# Patient Record
Sex: Male | Born: 1995 | Race: White | Hispanic: No | Marital: Single | Smoking: Never smoker
Health system: Southern US, Community
[De-identification: ages and names within clinical notes are randomized; demographics above are authoritative.]

---
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
## 2017-09-22 IMAGING — CT CT ABD-PELV W/ CM
2 of 4 series · 16 of 46 positions shown, 18 images · IV contrast (iopamidol)
Comparison: None.

CLINICAL DATA: Right lower quadrant abdominal pain beginning
earlier today. Nausea.

EXAM:
CT ABDOMEN AND PELVIS WITH CONTRAST
TECHNIQUE: Multidetector CT imaging of the abdomen and pelvis was performed
using the standard protocol following bolus administration of
intravenous contrast.
CONTRAST:  100mL [id number] IOPAMIDOL ([id number]) INJECTION 61%

[Series 2: axial st · axial · 0.68mm/px · z∈[-594,-204]mm · 13 of 86 slices shown, 15 images]
[im 4/86  soft-tissue]
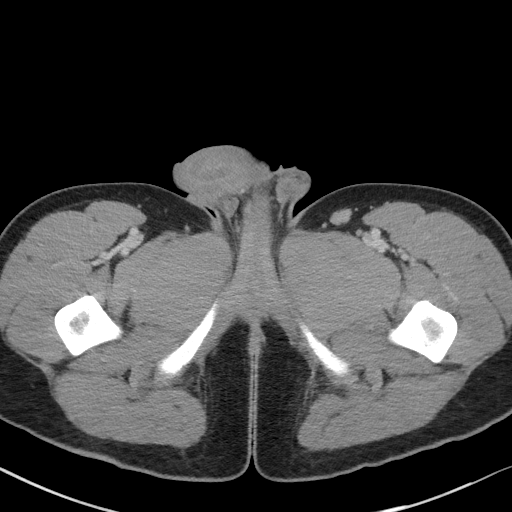
[im 4/86  bone]
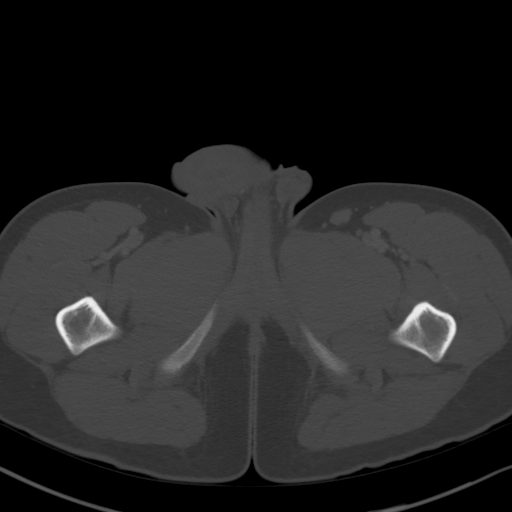
[im 11/86  soft-tissue]
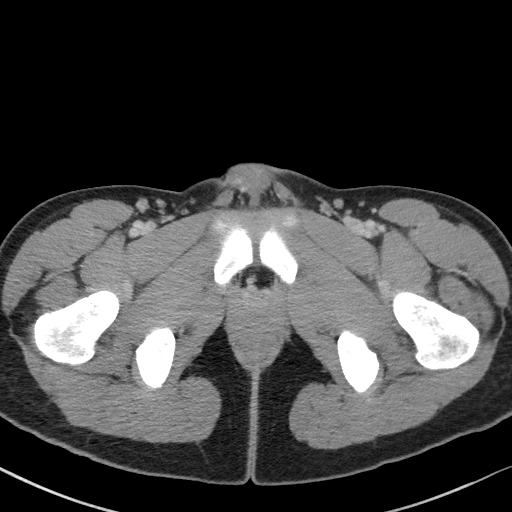
[im 18/86  soft-tissue]
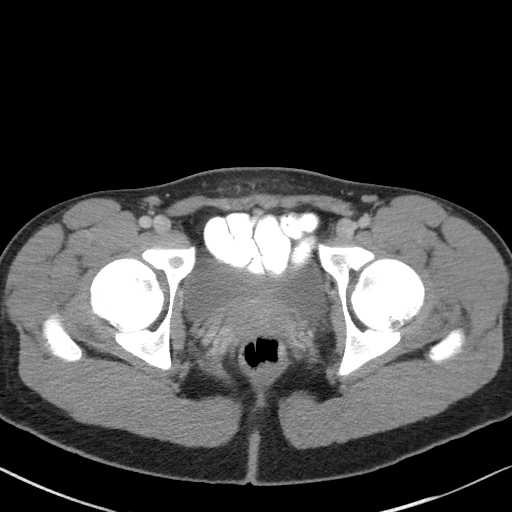
[im 25/86  soft-tissue]
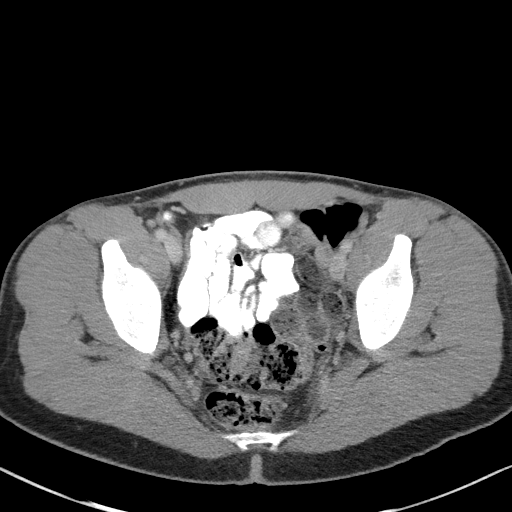
[im 29/86  soft-tissue]
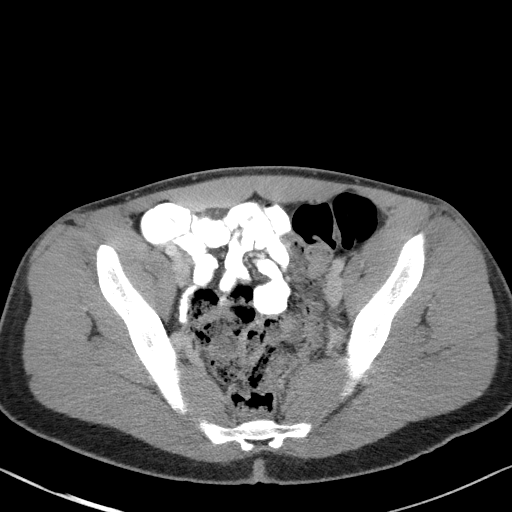
[im 36/86  soft-tissue]
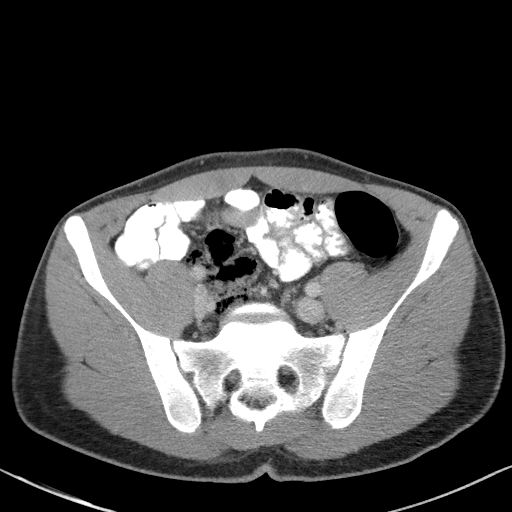
[im 43/86  soft-tissue]
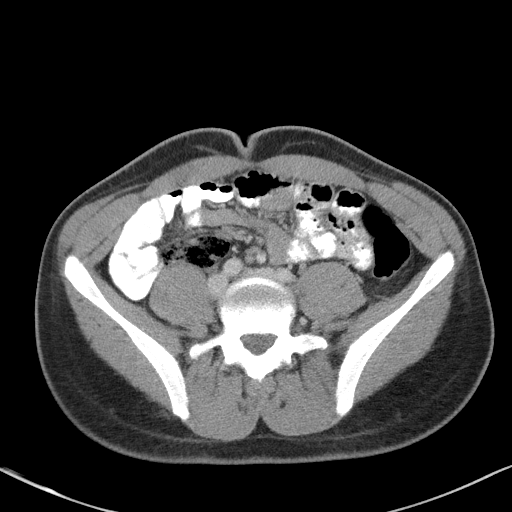
[im 50/86  soft-tissue]
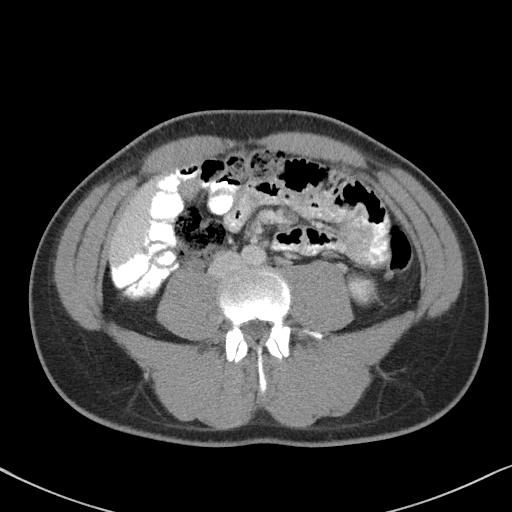
[im 57/86  soft-tissue]
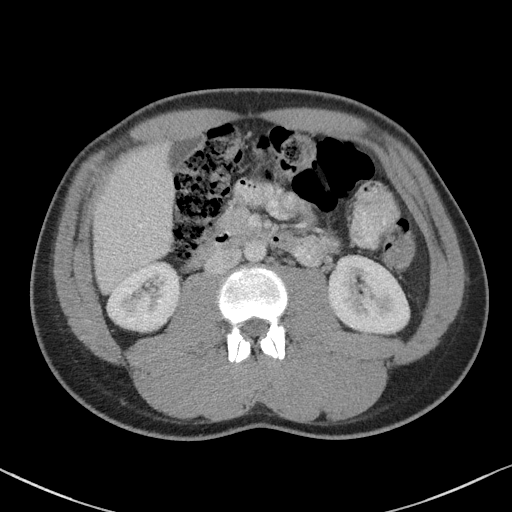
[im 57/86  bone]
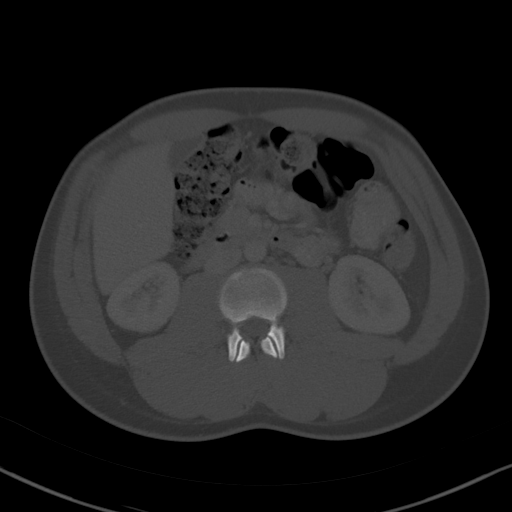
[im 61/86  soft-tissue]
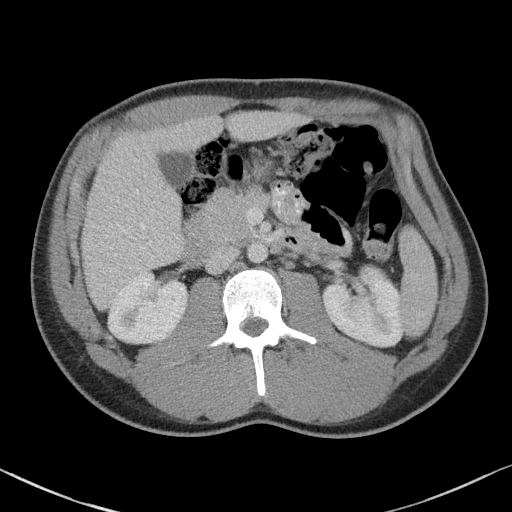
[im 68/86  soft-tissue]
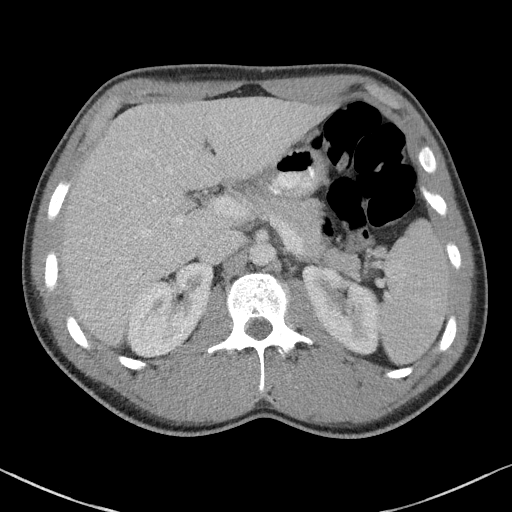
[im 75/86  soft-tissue]
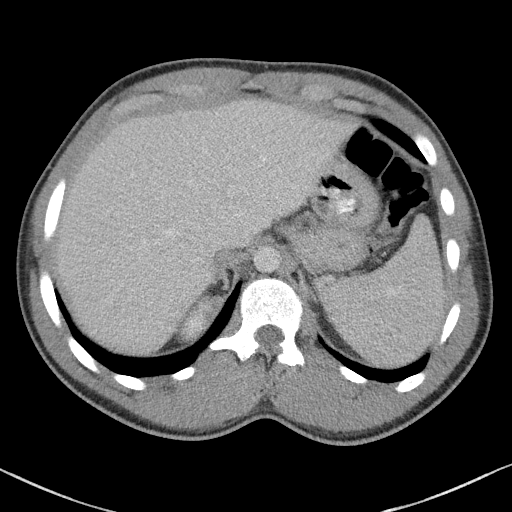
[im 82/86  soft-tissue]
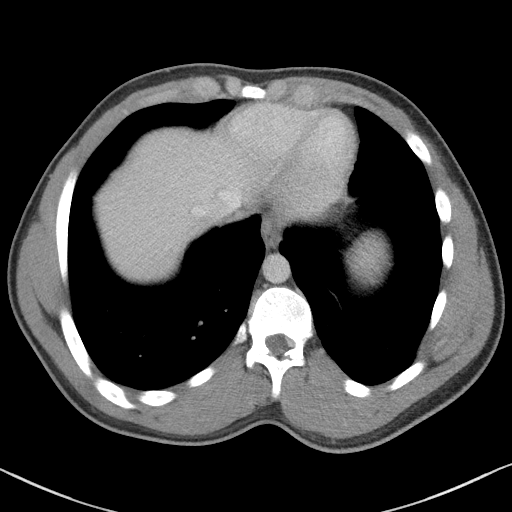

[Series 5: coronal st · coronal · 0.69mm/px · 3 of 99 slices shown]
[im 33/99  soft-tissue]
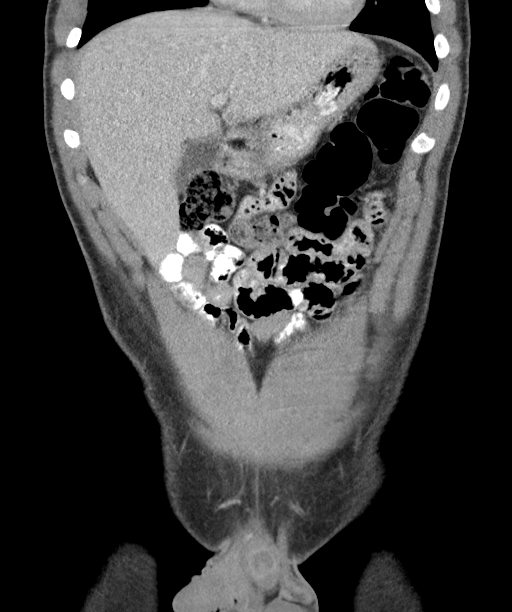
[im 44/99  soft-tissue]
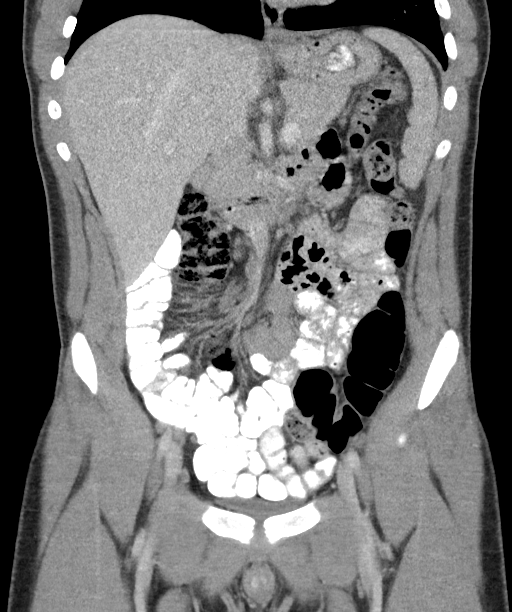
[im 55/99  soft-tissue]
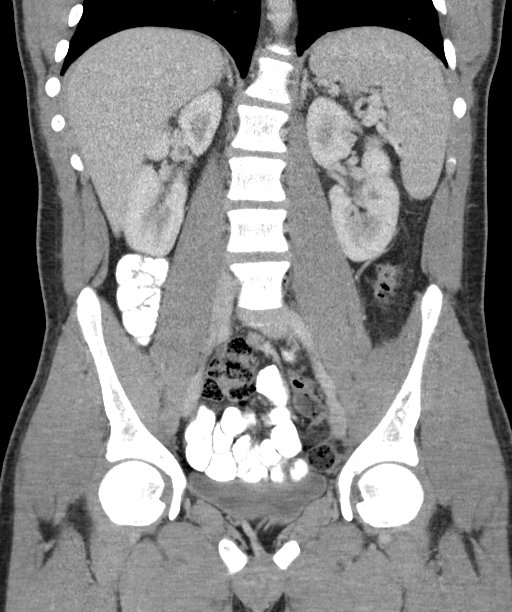

[16 of 46 positions shown; findings below may reference images not displayed]

FINDINGS: Lower chest: No acute abnormality.

Hepatobiliary: No focal liver abnormality is seen. No gallstones,
gallbladder wall thickening, or biliary dilatation.

Pancreas: Unremarkable. No pancreatic ductal dilatation or
surrounding inflammatory changes.

Spleen: Normal in size without focal abnormality.

Adrenals/Urinary Tract: Adrenal glands are unremarkable. Kidneys are
normal, without renal calculi, focal lesion, or hydronephrosis.
Bladder is unremarkable.

Stomach/Bowel: Stomach is within normal limits. Appendix appears
normal. No evidence of bowel wall thickening, distention, or
inflammatory changes.

Vascular/Lymphatic: No significant vascular findings are present. No
enlarged abdominal or pelvic lymph nodes.

Reproductive: Prostate is unremarkable.

Other: No abdominal wall hernia or abnormality. No abdominopelvic
ascites.

Musculoskeletal: No acute or significant osseous findings.
IMPRESSION: No acute process demonstrated in the abdomen or pelvis. No evidence
of bowel obstruction or inflammation.
